# Patient Record
Sex: Male | Born: 2000 | Race: Black or African American | Hispanic: No | Marital: Single | Smoking: Never smoker
Health system: Southern US, Community
[De-identification: ages and names within clinical notes are randomized; demographics above are authoritative.]

## PROBLEM LIST (undated history)

## (undated) DIAGNOSIS — T7840XA Allergy, unspecified, initial encounter: Secondary | ICD-10-CM

---
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
## 2022-06-20 IMAGING — US US RENAL ARTERY STENOSIS
1 series · 14 of 25 positions shown · non-contrast
Comparison: None.

CLINICAL DATA: 20-year-old male with history of hypertension.

EXAM:
RENAL/URINARY TRACT ULTRASOUND
RENAL DUPLEX DOPPLER ULTRASOUND

[Series 1: us renal artery stenosis · 0.26mm/px · 14 of 83 slices shown]
[im 1/83]
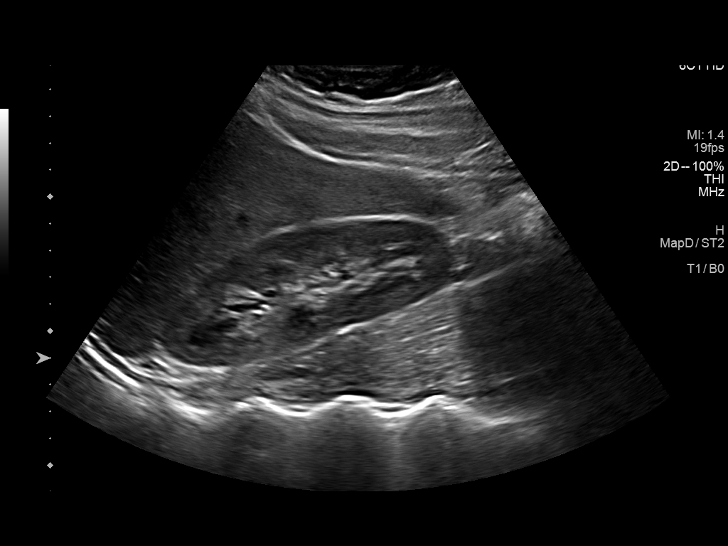
[im 7/83]
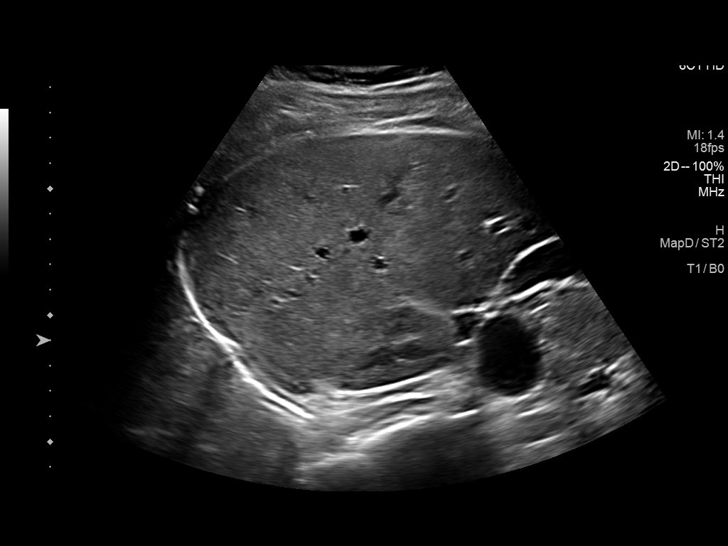
[im 14/83]
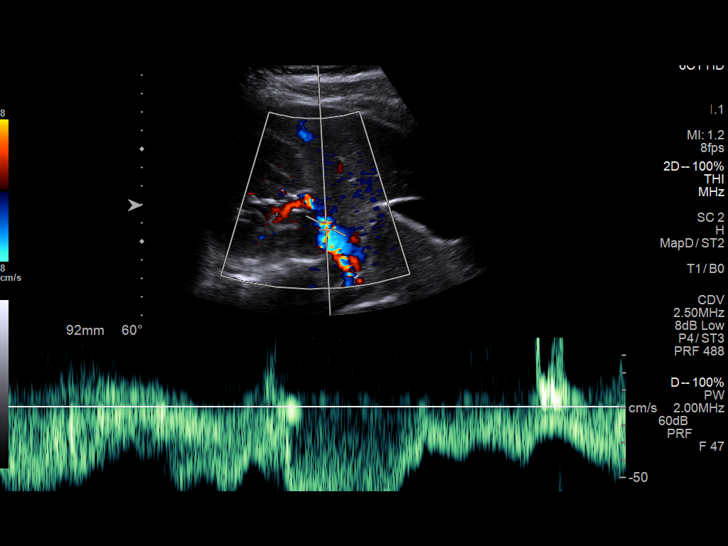
[im 21/83]
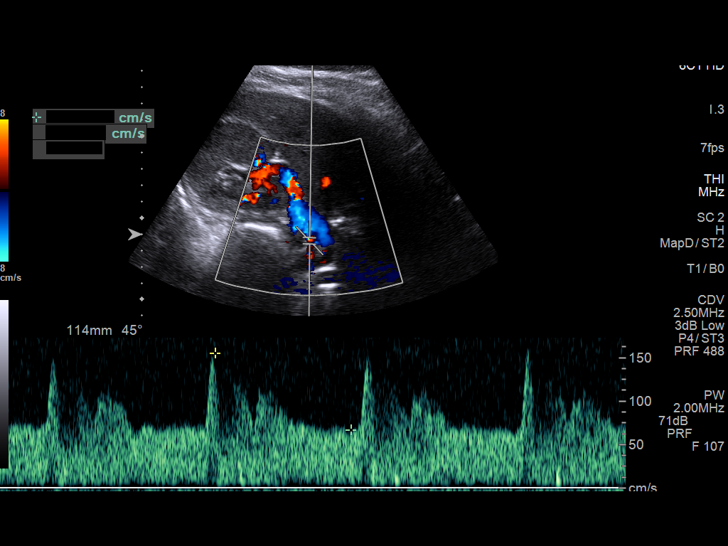
[im 28/83]
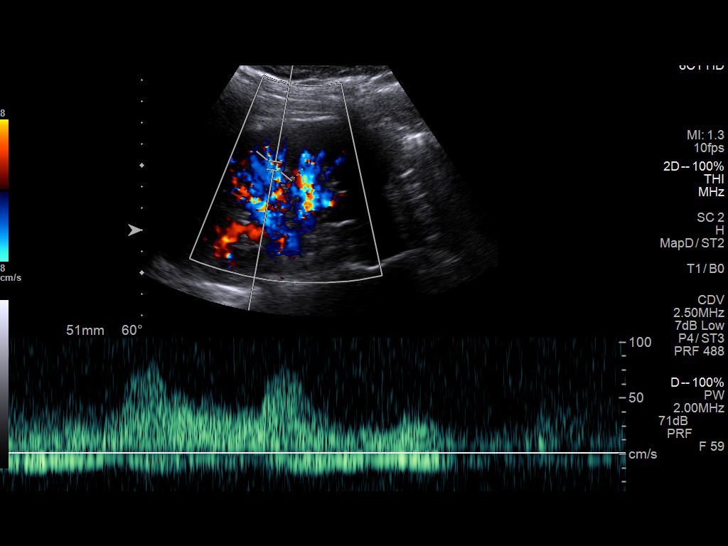
[im 31/83]
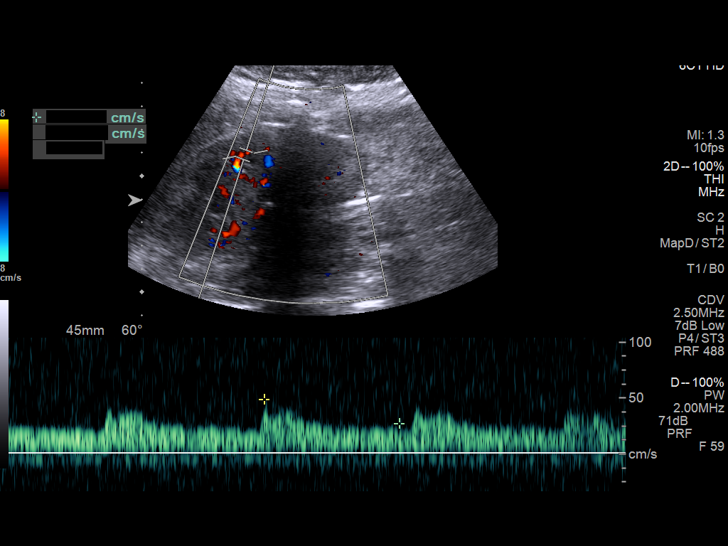
[im 38/83]
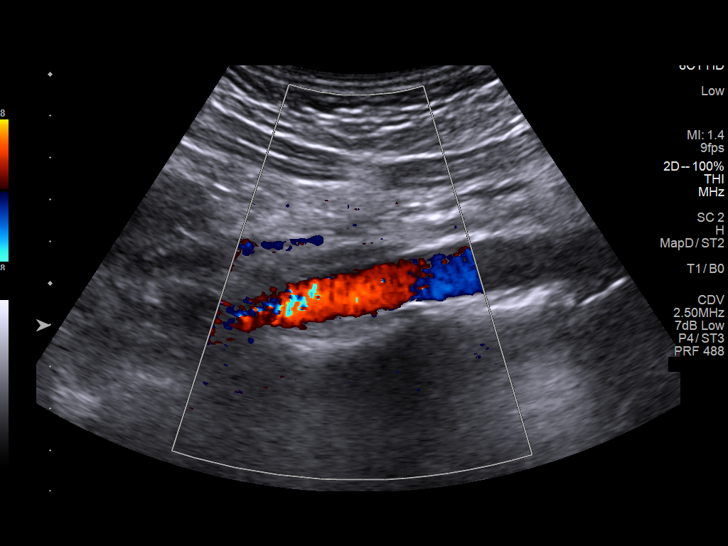
[im 45/83]
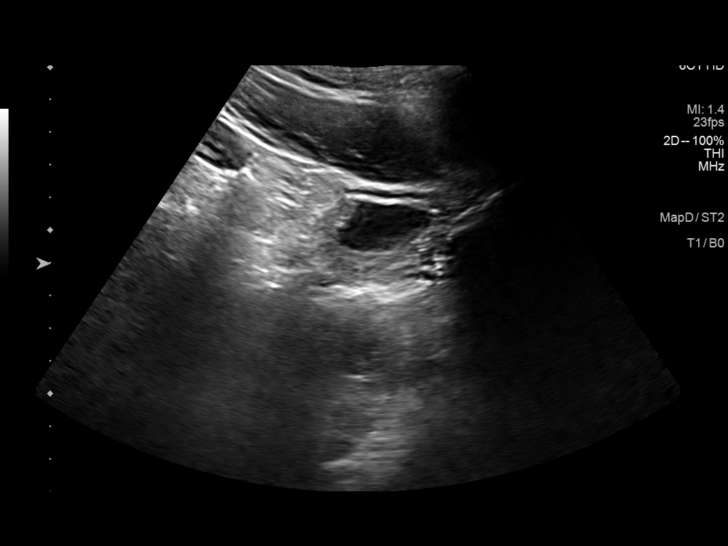
[im 52/83]
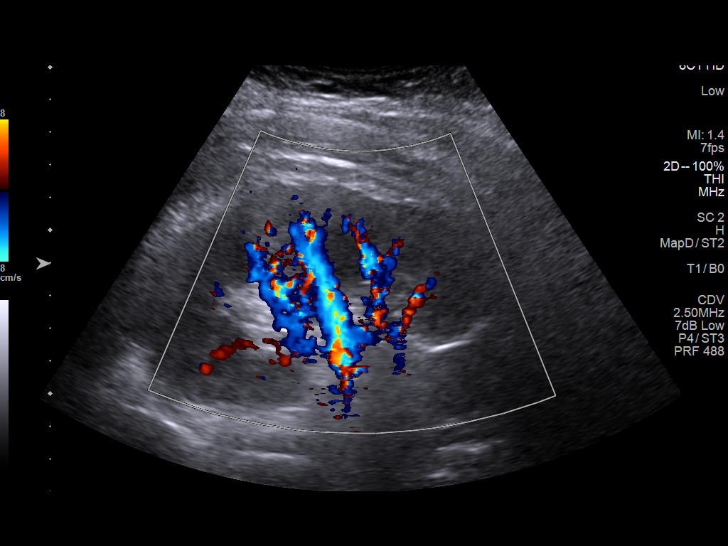
[im 55/83]
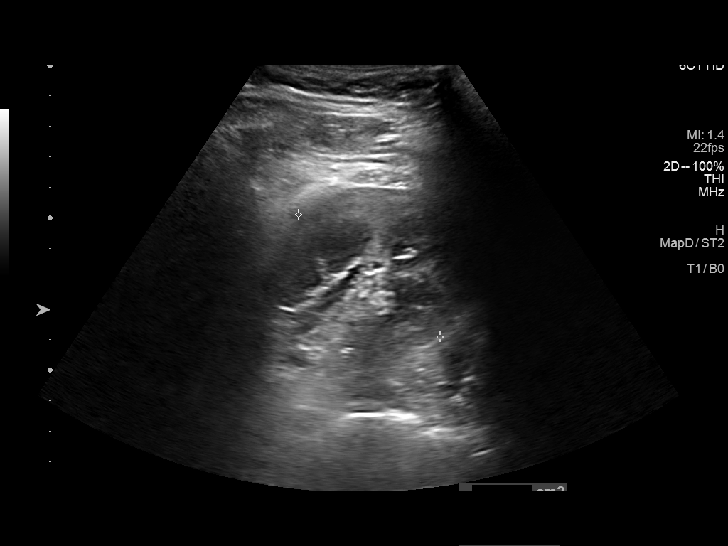
[im 62/83]
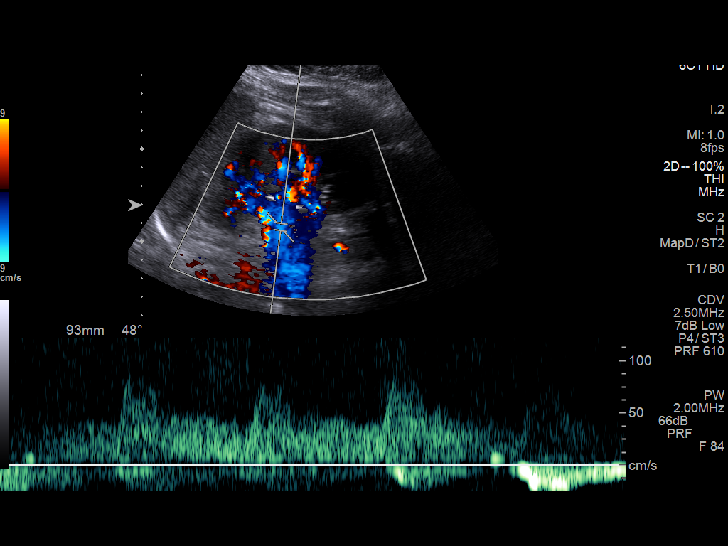
[im 69/83]
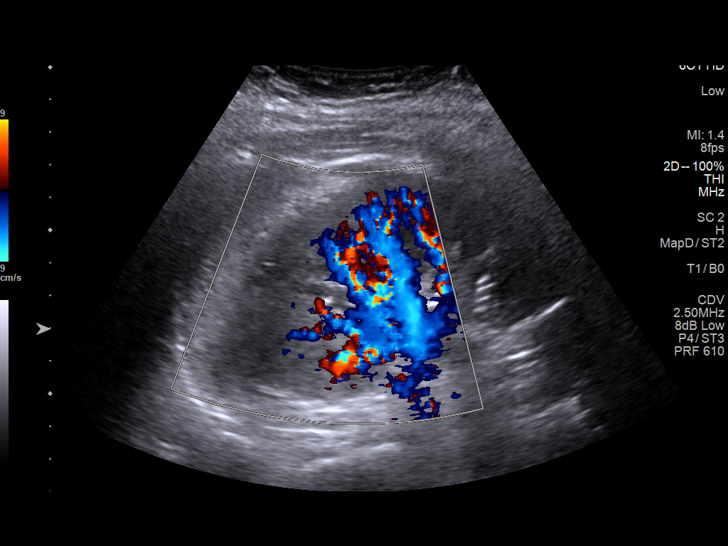
[im 76/83]
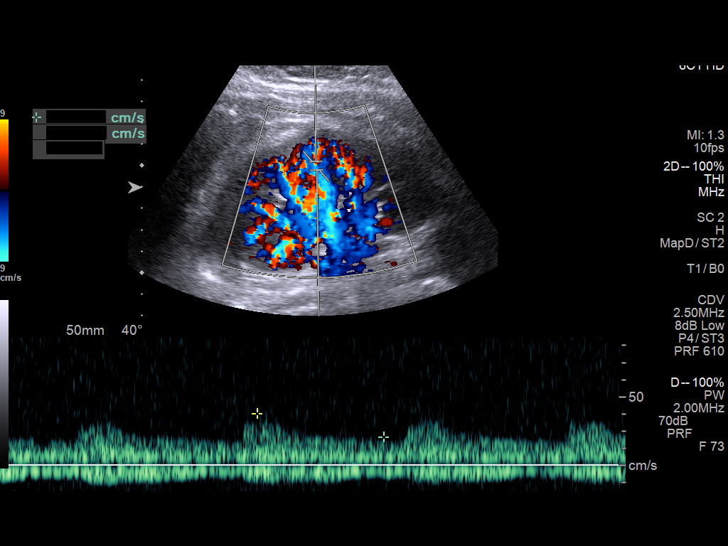
[im 83/83]
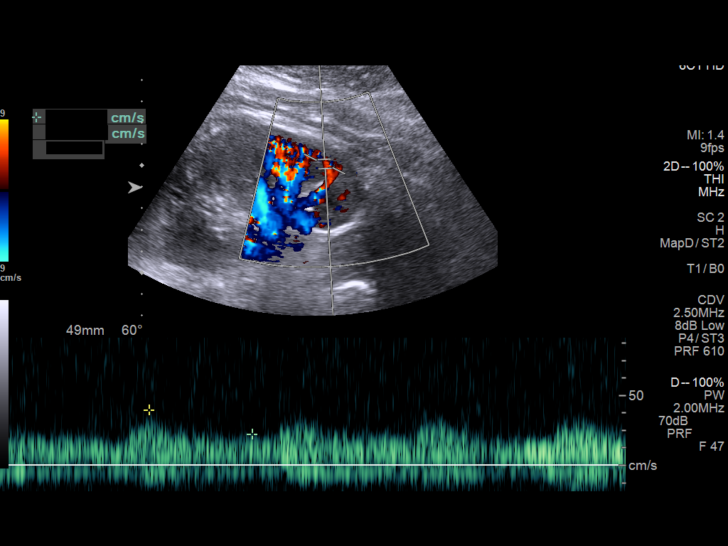

[14 of 25 positions shown; findings below may reference images not displayed]

FINDINGS: Right Kidney:

Length: 11.5 cm. Echogenicity within normal limits. No mass or
hydronephrosis visualized.

Left Kidney:

Length: 11.2 cm. Echogenicity within normal limits. No mass or
hydronephrosis visualized.

Bladder:  Decompressed, within normal limits.

RENAL DUPLEX ULTRASOUND

Right Renal Artery Velocities:

Origin:  155 cm/sec

Mid:  146 cm/sec

Hilum:  112 cm/sec

Interlobar:  96 cm/sec

Arcuate:  43 cm/sec

Left Renal Artery Velocities:

Origin:  71 cm/sec

Mid:  96 cm/sec

Hilum:  74 cm/sec

Interlobar:  82 cm/sec

Arcuate:  42 cm/sec

Aortic Velocity:  174 cm/sec

Right Renal-Aortic Ratios:

Origin:

Mid:

Hilum:

Interlobar:

Arcuate:

Left Renal-Aortic Ratios:

Origin:

Mid:

Hilum:

Interlobar:

Arcuate:
IMPRESSION: 1. No evidence of significant renal artery stenosis.
2. Normal sonographic appearance of the kidneys bilaterally.
